# Patient Record
Sex: Male | Born: 1955
Health system: Southern US, Community
[De-identification: ages and names within clinical notes are randomized; demographics above are authoritative.]

## PROBLEM LIST (undated history)

## (undated) HISTORY — PX: FINGER SURGERY: SHX640

## (undated) HISTORY — PX: TONSILLECTOMY: SUR1361

## (undated) HISTORY — PX: ANKLE SURGERY: SHX546

---

## 2014-05-10 DIAGNOSIS — N138 Other obstructive and reflux uropathy: Secondary | ICD-10-CM | POA: Insufficient documentation

## 2014-05-10 DIAGNOSIS — N401 Enlarged prostate with lower urinary tract symptoms: Secondary | ICD-10-CM

## 2014-09-14 ENCOUNTER — Ambulatory Visit (INDEPENDENT_AMBULATORY_CARE_PROVIDER_SITE_OTHER): Payer: BLUE CROSS/BLUE SHIELD | Admitting: Family Medicine

## 2014-09-14 ENCOUNTER — Encounter: Payer: Self-pay | Admitting: Family Medicine

## 2014-09-14 VITALS — BP 126/78 | HR 69 | Ht 69.0 in | Wt 187.0 lb

## 2014-09-14 DIAGNOSIS — M722 Plantar fascial fibromatosis: Secondary | ICD-10-CM | POA: Diagnosis not present

## 2014-09-14 NOTE — Patient Instructions (Signed)
You have plantar fasciitis Take tylenol or aleve as needed for pain  Plantar fascia stretch for 20-30 seconds (do 3 of these) in morning Lowering/raise on a step exercises 3 x 10 once or twice a day - this is very important for long term recovery. Can add heel walks, toe walks forward and backward as well Ice heel for 15 minutes as needed. Avoid flat shoes/barefoot walking as much as possible. Arch straps have been shown to help with pain. Orthotics with heel lift may be helpful (try something like dr. Jari Sportsmanscholls active series). Steroid injection is a consideration for short term pain relief if you are struggling. Physical therapy is also an option. Follow up with me in 6 weeks.

## 2014-09-16 ENCOUNTER — Encounter: Payer: Self-pay | Admitting: Family Medicine

## 2014-09-16 DIAGNOSIS — M722 Plantar fascial fibromatosis: Secondary | ICD-10-CM | POA: Insufficient documentation

## 2014-09-16 NOTE — Assessment & Plan Note (Signed)
reviewed home exercises to do daily.  Icing, nsaids/tylenol as needed.  Arch binders, otc orthotics.  Consider physical therapy, injection if not improving.  F/u in 6 weeks.  Activities as tolerated.

## 2014-09-16 NOTE — Progress Notes (Signed)
PCP: No primary care provider on file.  Subjective:   HPI: Patient is a 59 y.o. male here for right heel pain.  Patient reports right heel pain started on 6/22 when he changed running route that included more hills than usual. No acute injury. Painful when driving down the road. No swelilng or bruising. Not running now. Using a night sock to keep in dorsiflexion. Has been taping. Worse when walking at the beach this weekend.  No past medical history on file.  No current outpatient prescriptions on file prior to visit.   No current facility-administered medications on file prior to visit.    Past Surgical History  Procedure Laterality Date  . Ankle surgery      No Known Allergies  History   Social History  . Marital Status: Married    Spouse Name: N/A  . Number of Children: N/A  . Years of Education: N/A   Occupational History  . Not on file.   Social History Main Topics  . Smoking status: Never Smoker   . Smokeless tobacco: Not on file  . Alcohol Use: Not on file  . Drug Use: Not on file  . Sexual Activity: Not on file   Other Topics Concern  . Not on file   Social History Narrative    No family history on file.  BP 126/78 mmHg  Pulse 69  Ht  (1.753 m)  Wt 187 lb (84.823 kg)  BMI 27.60 kg/m2  Review of Systems: See HPI above.    Objective:  Physical Exam:  Gen: NAD  Right foot/ankle: No gross deformity, swelling, ecchymoses FROM TTP anterior plantar calcaneus at plantar fascia insertion. Negative ant drawer and talar tilt.   Negative syndesmotic compression. Thompsons test negative. NV intact distally.    Assessment & Plan:  1. Right plantar fasciitis - reviewed home exercises to do daily.  Icing, nsaids/tylenol as needed.  Arch binders, otc orthotics.  Consider physical therapy, injection if not improving.  F/u in 6 weeks.  Activities as tolerated.

## 2014-10-26 ENCOUNTER — Encounter (INDEPENDENT_AMBULATORY_CARE_PROVIDER_SITE_OTHER): Payer: Self-pay

## 2014-10-26 ENCOUNTER — Encounter: Payer: Self-pay | Admitting: Family Medicine

## 2014-10-26 ENCOUNTER — Ambulatory Visit: Payer: BLUE CROSS/BLUE SHIELD | Admitting: Family Medicine

## 2014-10-26 ENCOUNTER — Ambulatory Visit (INDEPENDENT_AMBULATORY_CARE_PROVIDER_SITE_OTHER): Payer: BLUE CROSS/BLUE SHIELD | Admitting: Family Medicine

## 2014-10-26 VITALS — BP 143/84 | HR 66 | Ht 69.0 in | Wt 187.0 lb

## 2014-10-26 DIAGNOSIS — M722 Plantar fascial fibromatosis: Secondary | ICD-10-CM

## 2014-10-26 NOTE — Patient Instructions (Signed)
Continue with home exercises, inserts as you have been. Do these for at least another 6 weeks. Call me if you want to try physical therapy, active release, orthotics, injection (I don't think you'll need these). Follow up with me in 6 weeks or as needed otherwise.

## 2014-10-27 NOTE — Assessment & Plan Note (Signed)
Improving.  Patient would like to continue with home exercises, arch binders, otc orthotics.  He will consider physical therapy, injection if not improving.  Follow up with me in 6 weeks or as needed.

## 2014-10-27 NOTE — Progress Notes (Signed)
PCP: No primary care provider on file.  Subjective:   HPI: Patient is a 59 y.o. male here for right heel pain.  7/19: Patient reports right heel pain started on 6/22 when he changed running route that included more hills than usual. No acute injury. Painful when driving down the road. No swelilng or bruising. Not running now. Using a night sock to keep in dorsiflexion. Has been taping. Worse when walking at the beach this weekend.  8/30: Patient reports he has improved especially in past week. Doing home exercises, using arch binders. Has inserts as well in his shoes. Has not tried running yet. Pain level 2/10. Noticed pain worsened when he wore old tennis shoes without inserts when working in the yard.  No past medical history on file.  No current outpatient prescriptions on file prior to visit.   No current facility-administered medications on file prior to visit.    Past Surgical History  Procedure Laterality Date  . Ankle surgery      No Known Allergies  Social History   Social History  . Marital Status: Married    Spouse Name: N/A  . Number of Children: N/A  . Years of Education: N/A   Occupational History  . Not on file.   Social History Main Topics  . Smoking status: Never Smoker   . Smokeless tobacco: Not on file  . Alcohol Use: Not on file  . Drug Use: Not on file  . Sexual Activity: Not on file   Other Topics Concern  . Not on file   Social History Narrative    No family history on file.  BP 143/84 mmHg  Pulse 66  Ht  (1.753 m)  Wt 187 lb (84.823 kg)  BMI 27.60 kg/m2  Review of Systems: See HPI above.    Objective:  Physical Exam:  Gen: NAD  Right foot/ankle: No gross deformity, swelling, ecchymoses FROM Mild TTP anterior plantar calcaneus at plantar fascia insertion. Negative ant drawer and talar tilt.   Negative syndesmotic compression. Thompsons test negative. NV intact distally.    Assessment & Plan:  1. Right  plantar fasciitis - Improving.  Patient would like to continue with home exercises, arch binders, otc orthotics.  He will consider physical therapy, injection if not improving.  Follow up with me in 6 weeks or as needed.

## 2017-06-28 ENCOUNTER — Other Ambulatory Visit: Payer: Self-pay

## 2017-06-28 ENCOUNTER — Encounter (HOSPITAL_BASED_OUTPATIENT_CLINIC_OR_DEPARTMENT_OTHER): Payer: Self-pay | Admitting: Emergency Medicine

## 2017-06-28 ENCOUNTER — Emergency Department (HOSPITAL_BASED_OUTPATIENT_CLINIC_OR_DEPARTMENT_OTHER): Payer: BLUE CROSS/BLUE SHIELD

## 2017-06-28 ENCOUNTER — Emergency Department (HOSPITAL_BASED_OUTPATIENT_CLINIC_OR_DEPARTMENT_OTHER)
Admission: EM | Admit: 2017-06-28 | Discharge: 2017-06-28 | Disposition: A | Discharge status: Home / Self Care | Payer: BLUE CROSS/BLUE SHIELD | Attending: Emergency Medicine | Admitting: Emergency Medicine

## 2017-06-28 DIAGNOSIS — M10072 Idiopathic gout, left ankle and foot: Secondary | ICD-10-CM | POA: Insufficient documentation

## 2017-06-28 DIAGNOSIS — R52 Pain, unspecified: Secondary | ICD-10-CM

## 2017-06-28 DIAGNOSIS — M109 Gout, unspecified: Secondary | ICD-10-CM

## 2017-06-28 DIAGNOSIS — M79675 Pain in left toe(s): Secondary | ICD-10-CM | POA: Diagnosis present

## 2017-06-28 MED ORDER — PREDNISONE 20 MG PO TABS: 20.0000 mg | ORAL_TABLET | Freq: Every day | ORAL | 0 refills | Status: AC

## 2017-06-28 MED ORDER — COLCHICINE 0.6 MG PO TABS: 0.6000 mg | ORAL_TABLET | Freq: Every day | ORAL | 0 refills | Status: DC

## 2017-06-28 MED ORDER — TRAMADOL HCL 50 MG PO TABS: 50.0000 mg | ORAL_TABLET | Freq: Four times a day (QID) | ORAL | 0 refills | Status: DC | PRN

## 2017-06-28 MED FILL — predniSONE 20 MG TABS: 20 | 5 days supply | Qty: 5 | Fill #0

## 2017-06-28 MED FILL — traMADol HCL 50 MG TABS: 50 | 2 days supply | Qty: 10 | Fill #0

## 2017-06-28 MED FILL — COLCHICINE 0.6 MG TABS: 0.6 | 7 days supply | Qty: 7 | Fill #0

## 2017-06-28 NOTE — ED Provider Notes (Signed)
Emergency Department Provider Note   I have reviewed the triage vital signs and the nursing notes.   HISTORY  Chief Complaint Foot Pain   HPI Jeremy Juarez is a 62 y.o. male with PMH of BPH presents to the emergency department for evaluation of pain of the left great toe.  Symptoms have been worsening over the past 2 days.  The patient did some hiking recently which is not out of the ordinary for him.  He denies any pain during the hike or injury.  He noticed pain in the left foot/toe with developing redness yesterday.  No fevers or chills.  He states he has severe pain that is worse with movement or even light touching of the area.  Last night he had difficulty sleeping and could not even allow the sheets to rest on his foot.  No pain in the ankle, knee, hip.  No redness tracking up the foot or leg. No history of gout.    History reviewed. No pertinent past medical history.  Patient Active Problem List   Diagnosis Date Noted  . Plantar fasciitis, right 09/16/2014  . Benign prostatic hyperplasia with urinary obstruction 05/10/2014    Past Surgical History:  Procedure Laterality Date  . ANKLE SURGERY    . FINGER SURGERY    . TONSILLECTOMY        Allergies Patient has no known allergies.  No family history on file.  Social History Social History   Tobacco Use  . Smoking status: Never Smoker  . Smokeless tobacco: Never Used  Substance Use Topics  . Alcohol use: Never    Alcohol/week: 0.0 oz    Frequency: Never  . Drug use: Never    Review of Systems  Constitutional: No fever/chills Eyes: No visual changes. ENT: No sore throat. Cardiovascular: Denies chest pain. Respiratory: Denies shortness of breath. Gastrointestinal: No abdominal pain.  No nausea, no vomiting.  No diarrhea.  No constipation. Genitourinary: Negative for dysuria. Musculoskeletal: Negative for back pain. Left great toe pain/redness.  Skin: Negative for rash. Neurological: Negative for  headaches, focal weakness or numbness.  10-point ROS otherwise negative.  ____________________________________________   PHYSICAL EXAM:  VITAL SIGNS: ED Triage Vitals  Enc Vitals Group     BP 06/28/17 0856 (!) 156/97     Pulse Rate 06/28/17 0856 74     Resp 06/28/17 0856 16     Temp 06/28/17 0856 98.5 F (36.9 C)     Temp Source 06/28/17 0856 Oral     SpO2 06/28/17 0856 95 %     Weight 06/28/17 0857 192 lb (87.1 kg)     Height 06/28/17 0857  (1.753 m)     Pain Score 06/28/17 0857 7   Constitutional: Alert and oriented. Well appearing and in no acute distress. Eyes: Conjunctivae are normal. Head: Atraumatic. Nose: No congestion/rhinnorhea. Mouth/Throat: Mucous membranes are moist.  Neck: No stridor.  Respiratory: Normal respiratory effort.  Gastrointestinal:  No distention.  Musculoskeletal: No lower extremity edema. Erythema and pain over the left 1 MTP. No erythema overlying the toe or dorsum of the foot.  Neurologic:  Normal speech and language. No gross focal neurologic deficits are appreciated.  Skin:  Skin is warm, dry and intact. Erythema over the left MTP as described above.   ____________________________________________  RADIOLOGY  Dg Foot Complete Left  Result Date: 06/28/2017 CLINICAL DATA:  Left great toe and foot pain EXAM: LEFT FOOT - COMPLETE 3+ VIEW COMPARISON:  None. FINDINGS: There is no  evidence of fracture or dislocation. There is no evidence of arthropathy or other focal bone abnormality. Soft tissues are unremarkable. IMPRESSION: Negative. Electronically Signed   By: Charlett Nose M.D.   On: 06/28/2017 09:39    ____________________________________________   PROCEDURES  Procedure(s) performed:   Procedures  None ____________________________________________   INITIAL IMPRESSION / ASSESSMENT AND PLAN / ED COURSE  Pertinent labs & imaging results that were available during my care of the patient were reviewed by me and considered in my  medical decision making (see chart for details).  Patient presents to the emergency department with left toe pain and redness.  This was in the setting of a recent hike.  Doubt hairline fracture but will obtain plain film of the left foot given no prior gout history.  The patient's presentation is most consistent with gout arthritis.  Discussed dietary modifications to prevent future flares.  Patient with no evidence of septic joint.  Plain film of the foot is negative for fracture. Plan for gout treatment and close PCP follow up.   At this time, I do not feel there is any life-threatening condition present. I have reviewed and discussed all results (EKG, imaging, lab, urine as appropriate), exam findings with patient. I have reviewed nursing notes and appropriate previous records.  I feel the patient is safe to be discharged home without further emergent workup. Discussed usual and customary return precautions. Patient and family (if present) verbalize understanding and are comfortable with this plan.  Patient will follow-up with their primary care provider. If they do not have a primary care provider, information for follow-up has been provided to them. All questions have been answered.  ____________________________________________  FINAL CLINICAL IMPRESSION(S) / ED DIAGNOSES  Final diagnoses:  Acute gout involving toe of left foot, unspecified cause    NEW OUTPATIENT MEDICATIONS STARTED DURING THIS VISIT:  Discharge Medication List as of 06/28/2017  9:53 AM    START taking these medications   Details  colchicine 0.6 MG tablet Take 1 tablet (0.6 mg total) by mouth daily for 7 days., Starting Fri 06/28/2017, Until Fri 07/05/2017, Print    predniSONE (DELTASONE) 20 MG tablet Take 1 tablet (20 mg total) by mouth daily for 5 days., Starting Fri 06/28/2017, Until Wed 07/03/2017, Print    traMADol (ULTRAM) 50 MG tablet Take 1 tablet (50 mg total) by mouth every 6 (six) hours as needed., Starting Fri  06/28/2017, Print        Note:  This document was prepared using Dragon voice recognition software and may include unintentional dictation errors.  Alona Bene, MD Emergency Medicine    Ashonti Leandro, Arlyss Repress, MD 06/28/17 [phone]

## 2017-06-28 NOTE — ED Notes (Signed)
Patient transported to X-ray 

## 2017-06-28 NOTE — ED Notes (Signed)
ED Provider at bedside. 

## 2017-06-28 NOTE — Discharge Instructions (Signed)
You were seen in the ED today with toe pain that is caused by gout. I have attached some information about this diagnosis. We are starting several medications to help your symptoms. Call your PCP to schedule a follow up appointment in the next 1-2 weeks. Return to the ED immediately with any fever, chills, or worsening redness in the foot/leg.

## 2017-06-28 NOTE — ED Triage Notes (Signed)
Pain and swelling to left great toe and just proximally x2 days with no known injury.

## 2017-10-23 ENCOUNTER — Encounter: Payer: Self-pay | Admitting: Rehabilitative and Restorative Service Providers"

## 2017-10-23 ENCOUNTER — Ambulatory Visit
Payer: BLUE CROSS/BLUE SHIELD | Attending: Orthopedic Surgery | Admitting: Rehabilitative and Restorative Service Providers"

## 2017-10-23 DIAGNOSIS — R29898 Other symptoms and signs involving the musculoskeletal system: Secondary | ICD-10-CM | POA: Diagnosis present

## 2017-10-23 DIAGNOSIS — M6281 Muscle weakness (generalized): Secondary | ICD-10-CM | POA: Diagnosis present

## 2017-10-23 DIAGNOSIS — M25572 Pain in left ankle and joints of left foot: Secondary | ICD-10-CM | POA: Diagnosis present

## 2017-10-23 NOTE — Therapy (Signed)
[facility] [place] [address]  [address] [place], [place], [address] Phone: [phone]   Fax:  [phone]  Physical Therapy Evaluation  Patient Details  Name: Jeremy GottronRobert Juarez MRN: [id number] Date of Birth: 12/05/1955 Referring Provider: Dr Toni Arthursjohn Hewitt    Encounter Date: 10/23/2017  PT End of Session - 10/23/17 1054    Visit Number  1    Number of Visits  12    Date for PT Re-Evaluation  12/04/17    PT Start Time  1054    PT Stop Time  1145    PT Time Calculation (min)  51 min    Activity Tolerance  Patient tolerated treatment well       History reviewed. No pertinent past medical history.  Past Surgical History:  Procedure Laterality Date  . ANKLE SURGERY    . FINGER SURGERY    . TONSILLECTOMY      There were no vitals filed for this visit.   Subjective Assessment - 10/23/17 1057    Subjective  Patient reports that he began to experience Rt foot pain 6/18 after running. He was treated with rest with no impovement. He was seen by second ortho last week with diagnosis of Posterior tibila tendon dysfunction. he was placed in a ankle foot orthosis with some impovement.     Pertinent History  foot injury stepping on metal rod 1986 with surgery following - he has had no trouble until 6/18; HOH     Diagnostic tests  MRI    Patient Stated Goals  avoid surgery; return to running     Currently in Pain?  Yes    Pain Score  1     Pain Location  Ankle    Pain Orientation  Right    Pain Descriptors / Indicators  Aching;Dull    Pain Type  Chronic pain    Pain Onset  More than a month ago    Pain Frequency  Intermittent    Aggravating Factors   direct pressure; running; stretching; jumping; standing on one leg    Pain Relieving Factors  brace; ice temporary improvement          [facility] PT Assessment - 10/23/17 0001      Assessment   Medical Diagnosis  Rt ankle/foot pain     Referring Provider  Dr Toni Arthursjohn Hewitt     Onset  Date/Surgical Date  07/27/16    Hand Dominance  Right    Next MD Visit  12/20/17    Prior Therapy  none      Precautions   Precautions  None      Balance Screen   Has the patient fallen in the past 6 months  No    Has the patient had a decrease in activity level because of a fear of falling?   No    Is the patient reluctant to leave their home because of a fear of falling?   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   retired Hotel managermilitary; Emergency planning/management officerpolice officer x 16 yrs    Leisure  running; hiking; yard work; chores around the house; wt lifting  free weights ~ 4o min/day 5 days/wk       Observation/Other Assessments   Focus on Therapeutic Outcomes (FOTO)   40% limitation       Sensation   Additional Comments  WNL's per pt report       Posture/Postural Control   Posture Comments  WNL's       AROM   Overall AROM Comments  WNL's bilat hips/knees    Right Ankle Dorsiflexion  3    Right Ankle Plantar Flexion  48    Right Ankle Inversion  24    Right Ankle Eversion  10    Left Ankle Dorsiflexion  6    Left Ankle Plantar Flexion  51    Left Ankle Inversion  22    Left Ankle Eversion  14      Strength   Overall Strength Comments  5/5 bilat hips/knees     Right Ankle Dorsiflexion  5/5    Right Ankle Plantar Flexion  4+/5   pain with slight decreased balance req touching for balance    Right Ankle Inversion  5/5   discomfort    Right Ankle Eversion  5/5    Left Ankle Dorsiflexion  5/5    Left Ankle Plantar Flexion  5/5    Left Ankle Inversion  5/5    Left Ankle Eversion  5/5      Palpation   Palpation comment  point tender medial ankle area                 Objective measurements completed on  examination: See above findings.      [facility] Adult PT Treatment/Exercise - 10/23/17 0001      Ankle Exercises: Stretches   Soleus Stretch  3 reps;30 seconds    Gastroc Stretch  3 reps;30 seconds      Ankle Exercises: Standing   SLS  30 sec x 3 each LE minimal UE support              PT Education - 10/23/17 1136    Education Details  HEP     Person(s) Educated  Patient    Methods  Explanation;Demonstration;Tactile cues;Verbal cues;Handout    Comprehension  Verbalized understanding;Returned demonstration;Verbal cues required;Tactile cues required          PT Long Term Goals - 10/23/17 1202      PT LONG TERM GOAL #1   Title  Decrease pain Rt ankle/foot by 50-75% allowing patient to ambulate > 1 mile without pain 12/04/17    Time  6    Period  Weeks    Status  New      PT LONG TERM GOAL #2   Title  Increase Rt ankle DF to 8 to 10 deg 12/04/17    Time  6    Period  Weeks    Status  New      PT LONG TERM GOAL #3   Title  Increase strength Rt ankle to 5/5 without pain 12/04/17    Time  6    Period  Weeks    Status  New      PT LONG TERM GOAL #4   Title  Independent HEP 12/04/17    Time  6    Period  Weeks    Status  New      PT LONG TERM GOAL #5   Title  Improve FOTO to </= 32% limitation 12/04/17    Time  6    Period  Weeks    Status  New             Plan - 10/23/17 1157    Clinical Impression Statement  Jeremy Juarez presents with 14 month history of Rt medial foot/ank;e/arch pain. He was disgnosed with posterir tibial tendon dysfunction and placed in an ankle brace last week. Patient reports that the brace has helped his pain significantly. He presents with persistent intermittent aching and discomfort in the medial Lt ankle/foot area; tenderness to palpation; decreased ROM and strength Rt ankle; decreased exercise/running ability; pain on a daily basis. Patient will benefit from PT to address problems identified.     Clinical Presentation  Stable    Clinical  Decision Making  Low    Rehab Potential  Good    PT Frequency  2x / week    PT Duration  6 weeks    PT Treatment/Interventions  Patient/family education;ADLs/Self Care Home Management;Cryotherapy;Electrical Stimulation;Iontophoresis 4mg /ml Dexamethasone;Moist Heat;Ultrasound;Dry needling;Manual techniques;Neuromuscular re-education;Gait training;Therapeutic activities;Therapeutic exercise    PT Next Visit Plan  review HEP; assess response to ionto; progress with stretching and stregthening Rt ankle/foot; modalities as indicated     Consulted and Agree with Plan of Care  Patient       Patient will benefit from skilled therapeutic intervention in order to improve the following deficits and impairments:  Pain, Decreased mobility, Decreased range of motion, Decreased strength, Decreased activity tolerance  Visit Diagnosis: Pain in left ankle and joints of left foot - Plan: PT plan of care cert/re-cert  Other symptoms and signs involving the musculoskeletal system - Plan: PT plan of care cert/re-cert  Muscle weakness (generalized) - Plan: PT plan of care cert/re-cert     Problem List Patient Active Problem List   Diagnosis Date Noted  . Plantar fasciitis, right 09/16/2014  . Benign prostatic hyperplasia with urinary obstruction 05/10/2014    Terriah Reggio Rober Minion PT, MPH 10/23/2017, 12:08 PM  [facility] [address]  [address] [place], [place], [address] Phone: [phone]   Fax:  [phone]  Name: Jeremy Juarez MRN: [id number] Date of Birth: [birth year]

## 2017-10-23 NOTE — Patient Instructions (Signed)

## 2017-10-30 ENCOUNTER — Encounter: Payer: Self-pay | Admitting: Physical Therapy

## 2017-10-30 ENCOUNTER — Ambulatory Visit: Payer: BLUE CROSS/BLUE SHIELD | Attending: Orthopedic Surgery | Admitting: Physical Therapy

## 2017-10-30 DIAGNOSIS — M25572 Pain in left ankle and joints of left foot: Secondary | ICD-10-CM | POA: Insufficient documentation

## 2017-10-30 DIAGNOSIS — M6281 Muscle weakness (generalized): Secondary | ICD-10-CM | POA: Diagnosis present

## 2017-10-30 DIAGNOSIS — R29898 Other symptoms and signs involving the musculoskeletal system: Secondary | ICD-10-CM

## 2017-10-30 DIAGNOSIS — M25571 Pain in right ankle and joints of right foot: Secondary | ICD-10-CM | POA: Insufficient documentation

## 2017-10-30 NOTE — Therapy (Signed)
[facility] [address]  [address] [place], [place], [address] Phone: [phone]   Fax:  [phone]  Physical Therapy Treatment  Patient Details  Name: Jeremy Juarez MRN: [id number] Date of Birth: [birth year] Referring Provider: Dr Toni Arthurs    Encounter Date: 10/30/2017  PT End of Session - 10/30/17 0802    Visit Number  2    Number of Visits  12    Date for PT Re-Evaluation  12/04/17    Authorization Type  [employer] - PT only    PT Start Time  0802    PT Stop Time  0845    PT Time Calculation (min)  43 min    Activity Tolerance  Patient tolerated treatment well    Behavior During Therapy  [facility] for tasks assessed/performed       History reviewed. No pertinent past medical history.  Past Surgical History:  Procedure Laterality Date  . ANKLE SURGERY    . FINGER SURGERY    . TONSILLECTOMY      There were no vitals filed for this visit.  Subjective Assessment - 10/30/17 0805    Subjective  Pt noting good relief from HEP stretches, waking up with less pain. Not sure of benefit from ionto patch.    Pertinent History  foot injury stepping on metal rod 1986 with surgery following - he has had no trouble until 6/18; HOH     Diagnostic tests  MRI    Patient Stated Goals  avoid surgery; return to running     Currently in Pain?  Yes    Pain Score  1    <1/10   Pain Location  Foot   arch   Pain Orientation  Right    Pain Descriptors / Indicators  Discomfort;Aching    Pain Type  Chronic pain                       [facility] Adult PT Treatment/Exercise - 10/30/17 0802      Self-Care   Self-Care  Heat/Ice Application    Heat/Ice Application  Provided verbal instruction in ice application inlcuding ice massage in the event of increased pain or irritation. Pt denying need for cryotherapy today.      Exercises   Exercises  Ankle      Ankle Exercises: Stretches   Soleus Stretch  2 reps;30 seconds    Gastroc Stretch  2 reps;30 seconds    Other Stretch  B posterior tibialis stretch with towel under lateral edge of foot 2 x 30 sec      Ankle Exercises: Aerobic   Recumbent Bike  L2 x 6 min      Ankle Exercises: Standing   SLS  Blue foam oval - 30 sec x 3 each LE minimal UE support (1 pole A)    Heel Raises  Both;10 reps;3 seconds;Right   R only eccentric lowering   Heel Raises Limitations  ~2" negative heel on back of UBE platform      Ankle Exercises: Seated   Other Seated Ankle Exercises  R 4 way ankle with red TB x10    Other Seated Ankle Exercises  Long sitting toe curl with red TB x10             PT Education - 10/30/17 0844    Education Details  HEP addtion - initial strengthening exercises with red TB; Instruction in use of cryotherapy for pain/inflammation management  Person(s) Educated  Patient    Methods  Explanation;Demonstration;Handout    Comprehension  Verbalized understanding;Returned demonstration          PT Long Term Goals - 10/23/17 1202      PT LONG TERM GOAL #1   Title  Decrease pain Rt ankle/foot by 50-75% allowing patient to ambulate > 1 mile without pain 12/04/17    Time  6    Period  Weeks    Status  New      PT LONG TERM GOAL #2   Title  Increase Rt ankle DF to 8 to 10 deg 12/04/17    Time  6    Period  Weeks    Status  New      PT LONG TERM GOAL #3   Title  Increase strength Rt ankle to 5/5 without pain 12/04/17    Time  6    Period  Weeks    Status  New      PT LONG TERM GOAL #4   Title  Independent HEP 12/04/17    Time  6    Period  Weeks    Status  New      PT LONG TERM GOAL #5   Title  Improve FOTO to </= 32% limitation 12/04/17    Time  6    Period  Weeks    Status  New            Plan - 10/30/17 0808    Clinical Impression Statement  Jeremy Juarez reporting good initial releif from HEP stretches and reports he has been able to increase SLS time to ~60 sec with HEP. Initated basic ankle strengthening today with good  tolerance and updated HEP accordingly. Pt noitng some fatigue but denies increased pain following exercises today - provided instruction in use of cryotherapy as needed for pain or increased irritation following exercises or general activity. with pt verbalizing understanding.    Rehab Potential  Good    PT Frequency  2x / week    PT Duration  6 weeks    PT Treatment/Interventions  Patient/family education;ADLs/Self Care Home Management;Cryotherapy;Electrical Stimulation;Iontophoresis 4mg /ml Dexamethasone;Moist Heat;Ultrasound;Dry needling;Manual techniques;Neuromuscular re-education;Gait training;Therapeutic activities;Therapeutic exercise    PT Next Visit Plan  --    Consulted and Agree with Plan of Care  Patient       Patient will benefit from skilled therapeutic intervention in order to improve the following deficits and impairments:  Pain, Decreased mobility, Decreased range of motion, Decreased strength, Decreased activity tolerance  Visit Diagnosis: Pain in left ankle and joints of left foot  Other symptoms and signs involving the musculoskeletal system  Muscle weakness (generalized)     Problem List Patient Active Problem List   Diagnosis Date Noted  . Plantar fasciitis, right 09/16/2014  . Benign prostatic hyperplasia with urinary obstruction 05/10/2014    [name], PT, MPT 10/30/2017, 9:01 AM  [facility] [address]  [address] [place], [place], [address] Phone: [phone]   Fax:  [phone]  Name: Jeremy Juarez MRN: [id number] Date of Birth: 19-Feb-1956

## 2017-11-04 ENCOUNTER — Ambulatory Visit: Payer: BLUE CROSS/BLUE SHIELD | Admitting: Physical Therapy

## 2017-11-04 DIAGNOSIS — M25572 Pain in left ankle and joints of left foot: Secondary | ICD-10-CM | POA: Diagnosis not present

## 2017-11-04 DIAGNOSIS — M6281 Muscle weakness (generalized): Secondary | ICD-10-CM

## 2017-11-04 DIAGNOSIS — M25571 Pain in right ankle and joints of right foot: Secondary | ICD-10-CM

## 2017-11-04 NOTE — Therapy (Signed)
[facility] Outpatient Rehabilitation [facility] [address]  [address] [place], [place], [address] Phone: [phone]   Fax:  [phone]  Physical Therapy Treatment  Patient Details  Name: Jeremy Juarez MRN: [id number] Date of Birth: 07-03-1955 Referring Provider: Dr Toni Arthurs    Encounter Date: 11/04/2017  PT End of Session - 11/04/17 0801    Visit Number  3    Number of Visits  12    Date for PT Re-Evaluation  12/04/17    Authorization Type  [employer] - PT only    PT Start Time  0801    PT Stop Time  0844    PT Time Calculation (min)  43 min    Activity Tolerance  Patient tolerated treatment well    Behavior During Therapy  [facility] for tasks assessed/performed       No past medical history on file.  Past Surgical History:  Procedure Laterality Date  . ANKLE SURGERY    . FINGER SURGERY    . TONSILLECTOMY      There were no vitals filed for this visit.  Subjective Assessment - 11/04/17 0842    Subjective  Patient continues to report improvement.    Pertinent History  foot injury stepping on metal rod 1986 with surgery following - he has had no trouble until 6/18; HOH     Diagnostic tests  MRI    Patient Stated Goals  avoid surgery; return to running     Currently in Pain?  Yes    Pain Score  1     Pain Location  Foot    Pain Orientation  Right    Pain Descriptors / Indicators  Aching;Discomfort    Pain Type  Chronic pain                       [facility] Adult PT Treatment/Exercise - 11/04/17 0001      Ankle Exercises: Standing   SLS  Blue foam - 30 sec x 3 RLE minimal UE support     Balance Master: Limits for Stability  step ups onto sitfit R 2x10    Balance Master: Static  isometric arch contraction through 1st metatarsal and heel 5 x 10 sec    Heel Raises  Right;5 reps;2 seconds   3 sets of 5    Heel Raises Limitations  off step    Toe Walk (Round Trip)  x 2      Ankle Exercises: Seated   Other Seated Ankle Exercises   R 4 way ankle with red TB 2x10    Other Seated Ankle Exercises  Long sitting toe curl with red TB x10                  PT Long Term Goals - 11/04/17 0841      PT LONG TERM GOAL #1   Title  Decrease pain Rt ankle/foot by 50-75% allowing patient to ambulate > 1 mile without pain 12/04/17    Baseline  still feeling with walking 1 mile 1/10    Time  6    Period  Weeks    Status  On-going            Plan - 11/04/17 9417    Clinical Impression Statement  Patient did well with TE today. He continues to have fatique with eccentric lowering with heel raises and with resisted inversion. Able to toe walk without difficulty.     Rehab  Potential  Good    PT Frequency  2x / week    PT Duration  6 weeks    PT Treatment/Interventions  Patient/family education;ADLs/Self Care Home Management;Cryotherapy;Electrical Stimulation;Iontophoresis 4mg /ml Dexamethasone;Moist Heat;Ultrasound;Dry needling;Manual techniques;Neuromuscular re-education;Gait training;Therapeutic activities;Therapeutic exercise    PT Next Visit Plan  continue with strengthening    Consulted and Agree with Plan of Care  Patient       Patient will benefit from skilled therapeutic intervention in order to improve the following deficits and impairments:  Pain, Decreased mobility, Decreased range of motion, Decreased strength, Decreased activity tolerance  Visit Diagnosis: Pain in right ankle and joints of right foot  Muscle weakness (generalized)     Problem List Patient Active Problem List   Diagnosis Date Noted  . Plantar fasciitis, right 09/16/2014  . Benign prostatic hyperplasia with urinary obstruction 05/10/2014    Jayesh Marbach PT 11/04/2017, 9:03 AM  [facility] [address]  [address] [place], [place], [address] Phone: [phone]   Fax:  [phone]  Name: Jeremy Juarez MRN: [id number] Date of Birth: 08-05-1955

## 2017-11-06 ENCOUNTER — Ambulatory Visit: Payer: BLUE CROSS/BLUE SHIELD | Admitting: Physical Therapy

## 2017-11-11 ENCOUNTER — Encounter: Payer: Self-pay | Admitting: Physical Therapy

## 2017-11-11 ENCOUNTER — Ambulatory Visit: Payer: BLUE CROSS/BLUE SHIELD | Admitting: Physical Therapy

## 2017-11-11 DIAGNOSIS — M25571 Pain in right ankle and joints of right foot: Secondary | ICD-10-CM

## 2017-11-11 DIAGNOSIS — M25572 Pain in left ankle and joints of left foot: Secondary | ICD-10-CM

## 2017-11-11 DIAGNOSIS — M6281 Muscle weakness (generalized): Secondary | ICD-10-CM

## 2017-11-11 DIAGNOSIS — R29898 Other symptoms and signs involving the musculoskeletal system: Secondary | ICD-10-CM

## 2017-11-11 NOTE — Therapy (Signed)
[facility] [address]  [address] [place], [place], [address] Phone: [phone]   Fax:  [phone]  Physical Therapy Treatment  Patient Details  Name: Jeremy Juarez MRN: [id number] Date of Birth: 12/24/1955 Referring Provider: Dr Toni Arthursjohn Hewitt    Encounter Date: 11/11/2017  PT End of Session - 11/11/17 0800    Visit Number  4    Number of Visits  12    Date for PT Re-Evaluation  12/04/17    Authorization Type  [employer] - PT only    PT Start Time  0800    PT Stop Time  0840    PT Time Calculation (min)  40 min    Activity Tolerance  Patient tolerated treatment well    Behavior During Therapy  [facility] for tasks assessed/performed       History reviewed. No pertinent past medical history.  Past Surgical History:  Procedure Laterality Date  . ANKLE SURGERY    . FINGER SURGERY    . TONSILLECTOMY      There were no vitals filed for this visit.  Subjective Assessment - 11/11/17 0802    Subjective  Pt denies pain today but still notes an awareness of a tight feeling in the arch of his foot.    Pertinent History  foot injury stepping on metal rod 1986 with surgery following - he has had no trouble until 6/18; HOH     Diagnostic tests  MRI    Patient Stated Goals  avoid surgery; return to running     Currently in Pain?  No/denies                       [facility] Treatment/Exercise - 11/11/17 0800      Exercises   Exercises  Ankle      Ankle Exercises: Aerobic   Recumbent Bike  L2 x 6 min      Ankle Exercises: Standing   Vector Stance  Right   10 reps   Vector Stance Limitations  4 way clocks on blue foam oval    SLS  R SLS on blue foam oval + B pallof press with red TB x10 each    Toe Walk (Round Trip)  90 ft x 2 - 2nd lap vaulting on toes    Balance Beam  Tandem gait fwd/back x 2; B Sidestepping x 2; B Sidestepping with heels/toes on beam x 2 each    Side Shuffle (Round Trip)  B  sidestepping with red TB at forefeet 2 x30 ft    Other Standing Ankle Exercises  R fwd step-ups to 8" step + blue foam oval x10    Other Standing Ankle Exercises  R lateral eccentric step-down with heel touch from blue foam oval on 6" step x10; 1 pole A      Ankle Exercises: Seated   Other Seated Ankle Exercises  R 4 way ankle with green TB x10                  PT Long Term Goals - 11/11/17 0805      PT LONG TERM GOAL #1   Title  Decrease pain Rt ankle/foot by 50-75% allowing patient to ambulate > 1 mile without pain 12/04/17    Status  On-going    Target Date  12/04/17      PT LONG TERM GOAL #2   Title  Increase Rt ankle DF to  8 to 10 deg 12/04/17    Status  On-going    Target Date  12/04/17      PT LONG TERM GOAL #3   Title  Increase strength Rt ankle to 5/5 without pain 12/04/17    Status  On-going    Target Date  12/04/17      PT LONG TERM GOAL #4   Title  Independent HEP 12/04/17    Status  On-going    Target Date  12/04/17      PT LONG TERM GOAL #5   Title  Improve FOTO to </= 32% limitation 12/04/17    Status  On-going    Target Date  12/04/17            Plan - 11/11/17 0804    Clinical Impression Statement  Robbie reporting continuing improvement in pain, now only noting sensation of tighness. Good tolerance for HEP strengthening exercises and able to progress theraband to green TB. Progressed therapeutic exercises & activities to include more unstable surfaces for proprioceptive training - good tolerance but challenged by balance with many activities.    Rehab Potential  Good    PT Frequency  2x / week    PT Duration  6 weeks    PT Treatment/Interventions  Patient/family education;ADLs/Self Care Home Management;Cryotherapy;Electrical Stimulation;Iontophoresis 4mg /ml Dexamethasone;Moist Heat;Ultrasound;Dry needling;Manual techniques;Neuromuscular re-education;Gait training;Therapeutic activities;Therapeutic exercise    Consulted and Agree with Plan of  Care  Patient       Patient will benefit from skilled therapeutic intervention in order to improve the following deficits and impairments:  Pain, Decreased mobility, Decreased range of motion, Decreased strength, Decreased activity tolerance  Visit Diagnosis: Pain in right ankle and joints of right foot  Muscle weakness (generalized)  Pain in left ankle and joints of left foot  Other symptoms and signs involving the musculoskeletal system     Problem List Patient Active Problem List   Diagnosis Date Noted  . Plantar fasciitis, right 09/16/2014  . Benign prostatic hyperplasia with urinary obstruction 05/10/2014    Jeremy Juarez, PT, MPT 11/11/2017, 8:42 AM  [facility] [address]  [address] [place], [place], [address] Phone: [phone]   Fax:  [phone]  Name: Jeremy Juarez MRN: [id number] Date of Birth: [birth year]

## 2017-11-13 ENCOUNTER — Encounter: Payer: BLUE CROSS/BLUE SHIELD | Admitting: Physical Therapy

## 2017-11-18 ENCOUNTER — Encounter: Payer: Self-pay | Admitting: Physical Therapy

## 2017-11-18 ENCOUNTER — Ambulatory Visit: Payer: BLUE CROSS/BLUE SHIELD | Admitting: Physical Therapy

## 2017-11-18 DIAGNOSIS — M25572 Pain in left ankle and joints of left foot: Secondary | ICD-10-CM | POA: Diagnosis not present

## 2017-11-18 DIAGNOSIS — M25571 Pain in right ankle and joints of right foot: Secondary | ICD-10-CM

## 2017-11-18 DIAGNOSIS — R29898 Other symptoms and signs involving the musculoskeletal system: Secondary | ICD-10-CM

## 2017-11-18 DIAGNOSIS — M6281 Muscle weakness (generalized): Secondary | ICD-10-CM

## 2017-11-18 NOTE — Therapy (Addendum)
[facility] [address]  [place], [place], [address] Phone: [phone]   Fax:  [phone]  Physical Therapy Treatment / Discharge Summary  Patient Details  Name: Jeremy Juarez MRN: [id number] Date of Birth: December 23, 1955 Referring Provider: Dr Wylene Simmer    Encounter Date: 11/18/2017  PT End of Session - 11/18/17 0801    Visit Number  5    Number of Visits  12    Date for PT Re-Evaluation  12/04/17    Authorization Type  [employer] - PT only    PT Start Time  0801    PT Stop Time  0842    PT Time Calculation (min)  41 min    Activity Tolerance  Patient tolerated treatment well    Behavior During Therapy  [facility] for tasks assessed/performed       History reviewed. No pertinent past medical history.  Past Surgical History:  Procedure Laterality Date  . ANKLE SURGERY    . FINGER SURGERY    . TONSILLECTOMY      There were no vitals filed for this visit.  Subjective Assessment - 11/18/17 0802    Subjective  Continues to note an awarenss of his foot, but denies pain. Arrives to PT having just completed 30 minutes on the ellipitcal.    Pertinent History  foot injury stepping on metal rod 1986 with surgery following - he has had no trouble until 6/18; HOH     Diagnostic tests  MRI    Patient Stated Goals  avoid surgery; return to running     Currently in Pain?  No/denies                       [facility] Adult PT Treatment/Exercise - 11/18/17 0801      Exercises   Exercises  Ankle      Manual Therapy   Manual Therapy  Soft tissue mobilization;Myofascial release;Passive ROM    Soft tissue mobilization  R distal posterior tibialis & plantar fascia    Myofascial Release  R plantar fascia & posterior tibialis tendon    Passive ROM  manual R plantar fascia & posterior tibialis stretches 2 x 30 sec each      Ankle Exercises: Standing   SLS  R SLS on inveryed BOSU 3 x 20 sec - intermittent 1 pole A     Rocker Board Limitations  inverted BOSU - lateral wt shift & heel-toe weight shift x 20 each, minisquats x20 - intermittent 1 pole A    Balance Master: Limits for Stability  R/L step ups onto BOSU x15 each - intermittent 1 pole A    Heel Walk (Round Trip)  1 lap - 90 ft    Toe Walk (Round Trip)  90 ft x 2 - 2nd lap vaulting on toes    Other Standing Ankle Exercises  Alt R/L fwd lunge x10      Ankle Exercises: Seated   Other Seated Ankle Exercises  Long sitting R ankle inversion/supination with green TB x15    Other Seated Ankle Exercises  Long sitting R toe curl with green TB x15                  PT Long Term Goals - 11/11/17 0805      PT LONG TERM GOAL #1   Title  Decrease pain Rt ankle/foot by 50-75% allowing patient to ambulate > 1 mile without pain 12/04/17  Status  On-going    Target Date  12/04/17      PT LONG TERM GOAL #2   Title  Increase Rt ankle DF to 8 to 10 deg 12/04/17    Status  On-going    Target Date  12/04/17      PT LONG TERM GOAL #3   Title  Increase strength Rt ankle to 5/5 without pain 12/04/17    Status  On-going    Target Date  12/04/17      PT LONG TERM GOAL #4   Title  Independent HEP 12/04/17    Status  On-going    Target Date  12/04/17      PT LONG TERM GOAL #5   Title  Improve FOTO to </= 32% limitation 12/04/17    Status  On-going    Target Date  12/04/17            Plan - 11/18/17 0807    Clinical Impression Statement  Jeremy Juarez continues to report an increased "awareness" of R foot, predominantly stiffness/tightness, but denies recent pain. Continues to note greatest challenge with SLS balance, esp on unstable surfaces, but good tolerance for therapy progression. Some crepitus noted along distal R posterior tibialis tendon & plantar fascia which repsonded well to manual therapy and stretching. Pt reporting good improvement this far with PT and will continue to progress toward goals.    Rehab Potential  Good    PT Frequency  --     PT Duration  --    PT Treatment/Interventions  Patient/family education;ADLs/Self Care Home Management;Cryotherapy;Electrical Stimulation;Iontophoresis 7m/ml Dexamethasone;Moist Heat;Ultrasound;Dry needling;Manual techniques;Neuromuscular re-education;Gait training;Therapeutic activities;Therapeutic exercise    Consulted and Agree with Plan of Care  Patient       Patient will benefit from skilled therapeutic intervention in order to improve the following deficits and impairments:  Pain, Decreased mobility, Decreased range of motion, Decreased strength, Decreased activity tolerance  Visit Diagnosis: Pain in right ankle and joints of right foot  Muscle weakness (generalized)  Pain in left ankle and joints of left foot  Other symptoms and signs involving the musculoskeletal system     Problem List Patient Active Problem List   Diagnosis Date Noted  . Plantar fasciitis, right 09/16/2014  . Benign prostatic hyperplasia with urinary obstruction 05/10/2014    JPercival Spanish PT, MPT 11/18/2017, 8:45 AM  [facility] [place] [address]Phone: [phone]  Fax:  [phone] Name: RDwan HemmelgarnMRN: [id number] of Birth: 9Mar 16, 1957 PHYSICAL THERAPY DISCHARGE SUMMARY  Visits from Start of Care: 5  Current functional level related to goals / functional outcomes:   Refer to above clinical impression for status as of last visit on 11/18/17. Further PT was recommended but pt did not return in >30 days, therefore will proceed with discharge from PT for this episode.   Remaining deficits:   As above.   Education / Equipment:   HEP  Plan: Patient agrees to discharge.  Patient goals were not met. Patient is being discharged due to not returning since the last visit.  ?????     JPercival Spanish PT, MPT 01/28/18, 8:20 AM  [facility] [place] [address]Phone: [phone]  Fax:  [phone]

## 2017-11-20 ENCOUNTER — Encounter: Payer: BLUE CROSS/BLUE SHIELD | Admitting: Physical Therapy

## 2017-11-25 ENCOUNTER — Ambulatory Visit: Payer: BLUE CROSS/BLUE SHIELD | Admitting: Physical Therapy

## 2019-09-20 IMAGING — CR DG FOOT COMPLETE 3+V*L*
3 series · 3 of 3 positions shown · non-contrast
Comparison: None.

CLINICAL DATA: Left great toe and foot pain

EXAM:
LEFT FOOT - COMPLETE 3+ VIEW

[t foot ap left]
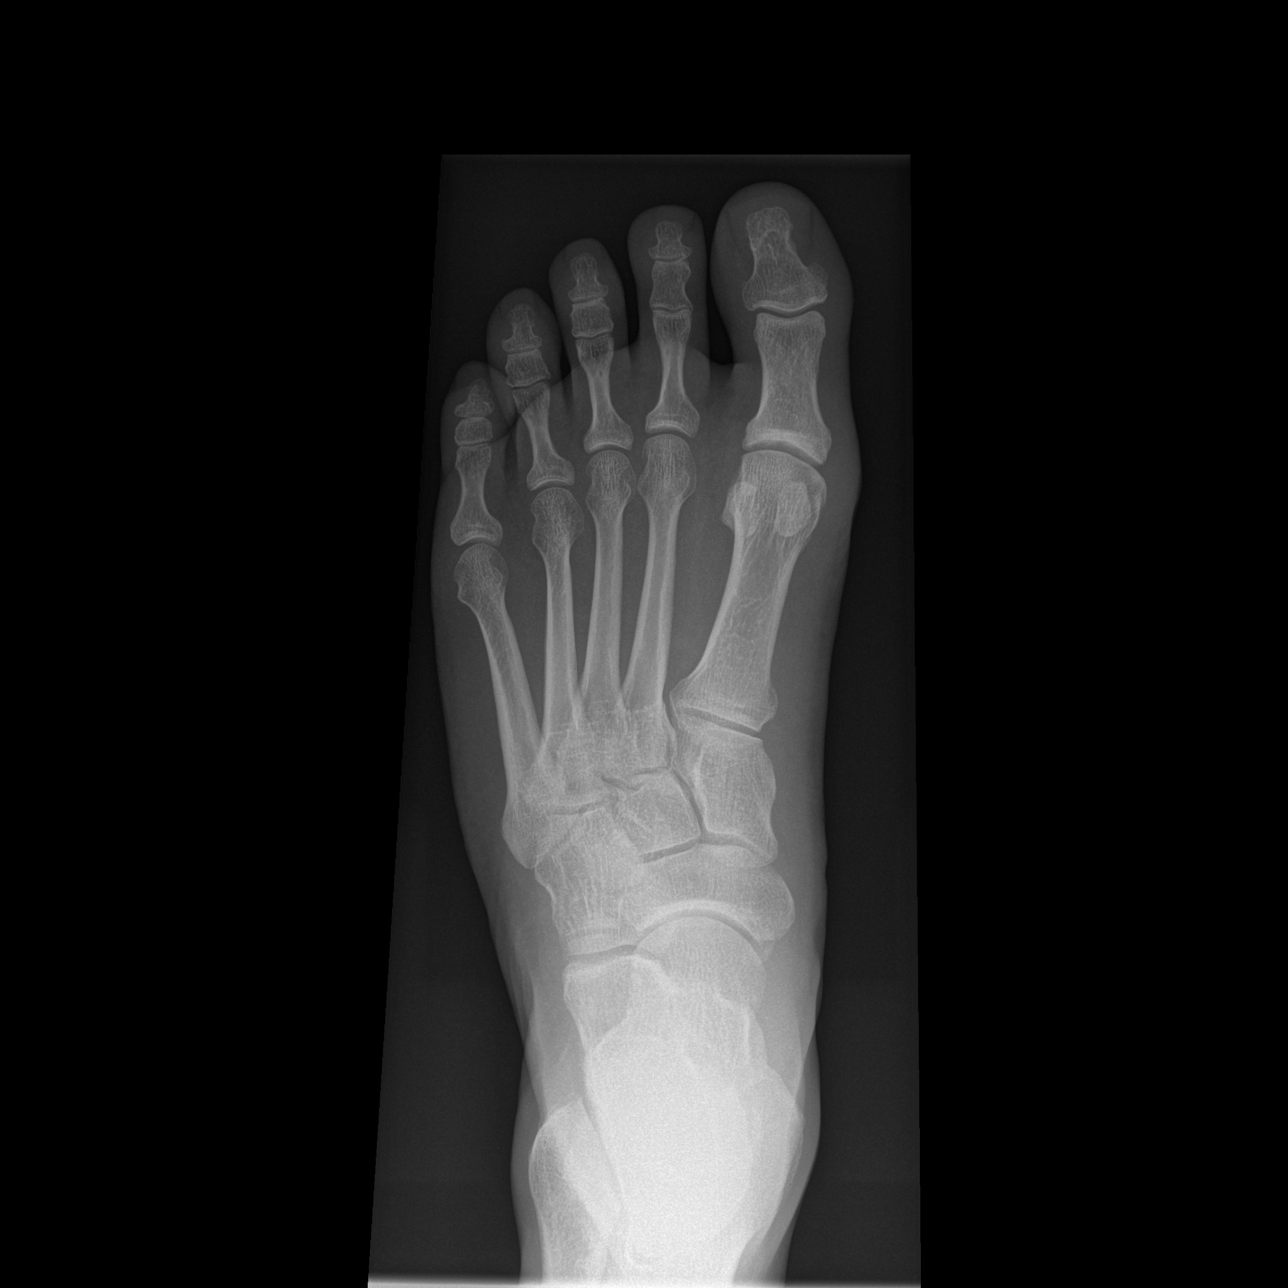

[t foot oblique left]
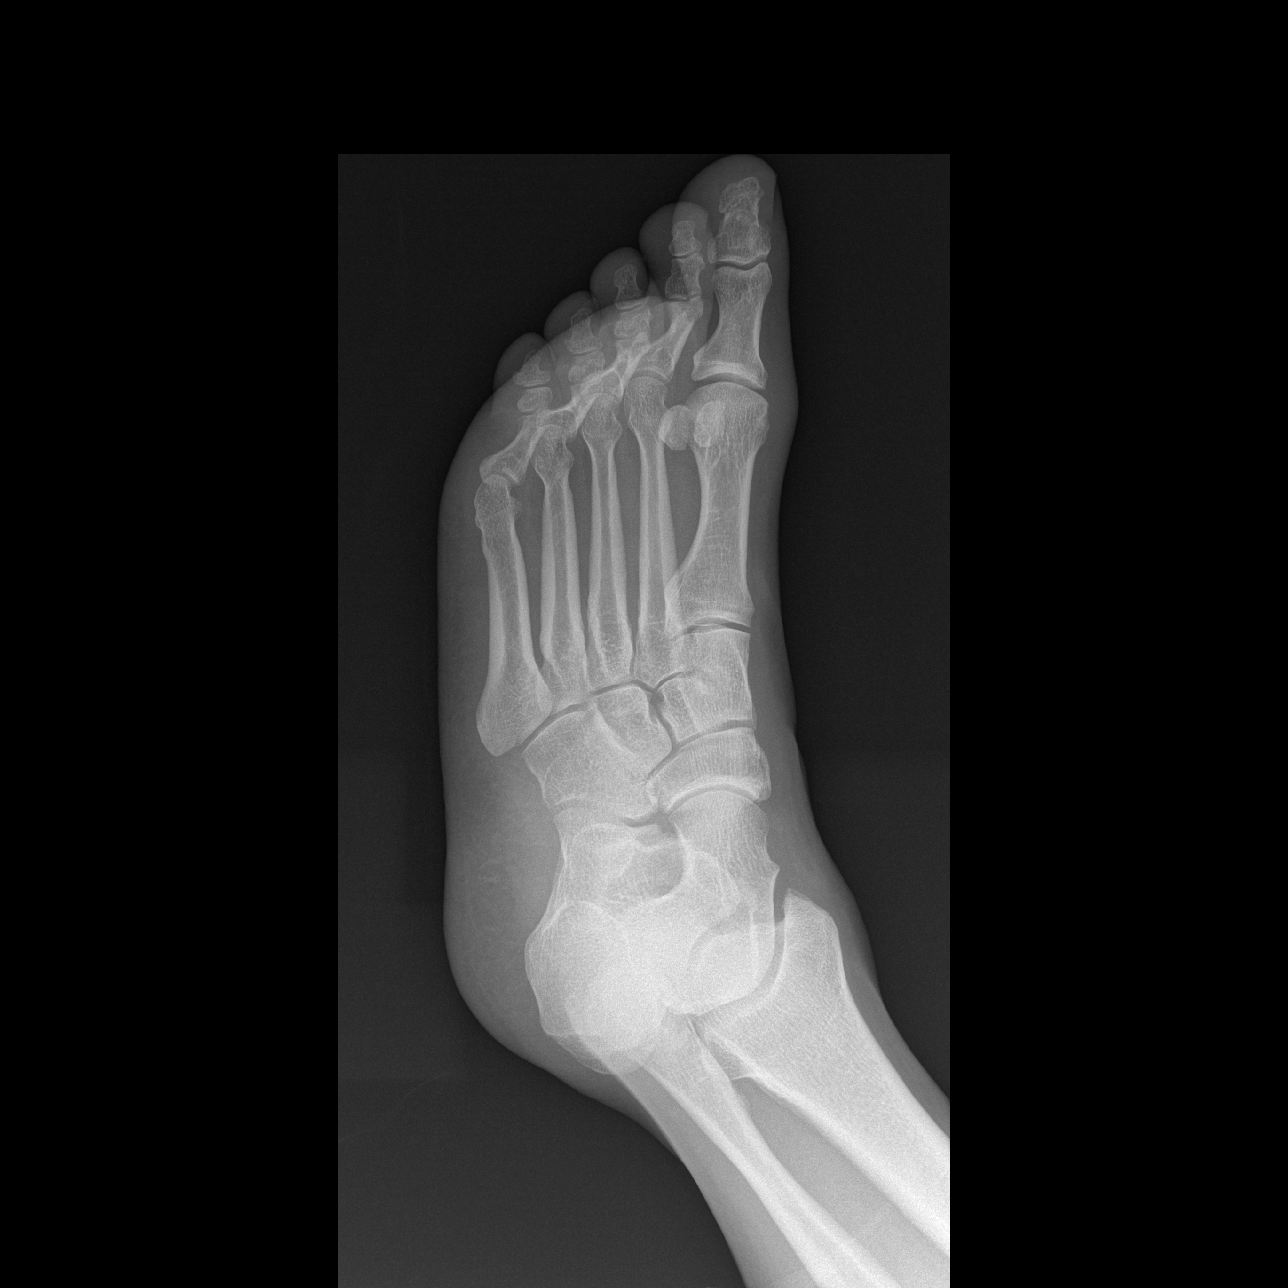

[t foot lat left]
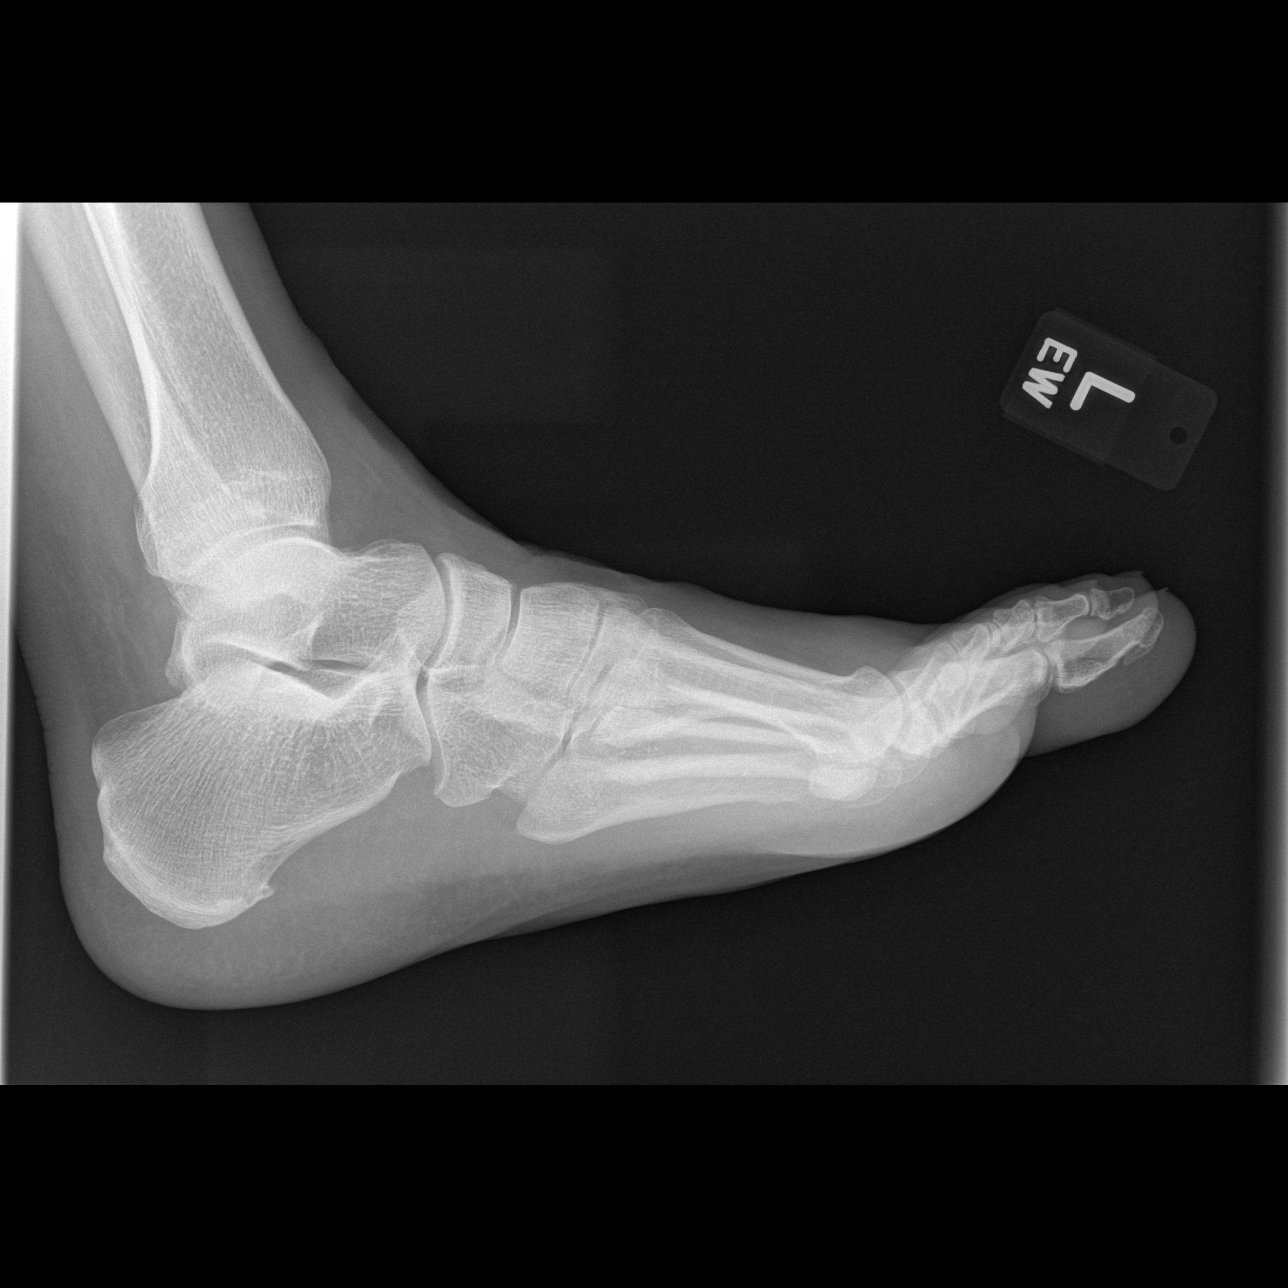

[3 of 3 positions shown; findings below may reference images not displayed]

FINDINGS: There is no evidence of fracture or dislocation. There is no
evidence of arthropathy or other focal bone abnormality. Soft
tissues are unremarkable.
IMPRESSION: Negative.

## 2021-08-21 ENCOUNTER — Encounter (HOSPITAL_BASED_OUTPATIENT_CLINIC_OR_DEPARTMENT_OTHER): Payer: Self-pay | Admitting: Emergency Medicine

## 2021-08-21 ENCOUNTER — Other Ambulatory Visit: Payer: Self-pay

## 2021-08-21 ENCOUNTER — Emergency Department (HOSPITAL_BASED_OUTPATIENT_CLINIC_OR_DEPARTMENT_OTHER)
Admission: EM | Admit: 2021-08-21 | Discharge: 2021-08-21 | Disposition: A | Discharge status: Home / Self Care | Payer: Managed Care, Other (non HMO) | Attending: Emergency Medicine | Admitting: Emergency Medicine

## 2021-08-21 DIAGNOSIS — R040 Epistaxis: Secondary | ICD-10-CM | POA: Insufficient documentation

## 2021-08-21 NOTE — ED Provider Notes (Signed)
[facility] EMERGENCY DEPARTMENT Provider Note   CSN: [id number] Arrival date & time: 08/21/21  1019     History  Chief Complaint  Patient presents with   Epistaxis    Jeremy Juarez is a 66 y.o. male.  66 year old male presents with concern for nosebleed which woke him from his sleep at 5am today, right side, resolved after a few minutes.  Patient woke up this morning and was moving laundry from the washer to the dryer, bent over when bleeding resumed and did not resolve with ice pack and pressure.  EMS was called, applied Afrin and bleeding resolved.  Denies history of recurrent bleeds, not anticoagulated, no injury to the nose.       Home Medications Prior to Admission medications   Medication Sig Start Date End Date Taking? Authorizing Provider  colchicine 0.6 MG tablet Take 1 tablet (0.6 mg total) by mouth daily for 7 days. 06/28/17 07/05/17  Long, Arlyss Repress, MD  traMADol (ULTRAM) 50 MG tablet Take 1 tablet (50 mg total) by mouth every 6 (six) hours as needed. Patient not taking: Reported on 10/23/2017 06/28/17   Long, Arlyss Repress, MD      Allergies    Patient has no known allergies.    Review of Systems   Review of Systems Negative except as per HPI Physical Exam Updated Vital Signs BP (!) 162/112   Pulse 80   Temp 98 F (36.7 C) (Oral)   Resp 16   SpO2 95%  Physical Exam Vitals and nursing note reviewed.  Constitutional:      General: He is not in acute distress.    Appearance: He is well-developed. He is not diaphoretic.  HENT:     Head: Normocephalic and atraumatic.     Nose: No nasal deformity or signs of injury.     Comments: Dried blood noted to left nare, somewhat irritated appearance of left septum.  Right side unremarkable.  No active bleeding.    Mouth/Throat:     Mouth: Mucous membranes are moist.     Pharynx: No oropharyngeal exudate or posterior oropharyngeal erythema.  Pulmonary:     Effort: Pulmonary effort is normal.  Skin:     General: Skin is warm and dry.     Findings: No erythema or rash.  Neurological:     Mental Status: He is alert and oriented to person, place, and time.  Psychiatric:        Behavior: Behavior normal.     ED Results / Procedures / Treatments   Labs (all labs ordered are listed, but only abnormal results are displayed) Labs Reviewed - No data to display  EKG None  Radiology No results found.  Procedures Procedures    Medications Ordered in ED Medications - No data to display  ED Course/ Medical Decision Making/ A&P                           Medical Decision Making  66 year old male presents with concern for nosebleed as above.  On exam, has area to left septum which appears irritated and may have been source of bleeding however no active bleeding at this time to definitively identify source.  Right nare normal.  Discussed management of nosebleeds at home, given referral to ENT if bleeding persists with return to ER precautions if unable to stop bleeding or severe bleeding occurs.        Final Clinical Impression(s) / ED Diagnoses Final  diagnoses:  Epistaxis    Rx / DC Orders ED Discharge Orders     None         Alden Hipp 08/21/21 1048    Cheryll Cockayne, MD 08/26/21 0900

## 2021-08-25 DIAGNOSIS — R040 Epistaxis: Secondary | ICD-10-CM | POA: Diagnosis present

## 2021-08-26 ENCOUNTER — Other Ambulatory Visit: Payer: Self-pay

## 2021-08-26 ENCOUNTER — Emergency Department (HOSPITAL_BASED_OUTPATIENT_CLINIC_OR_DEPARTMENT_OTHER)
Admission: EM | Admit: 2021-08-26 | Discharge: 2021-08-26 | Disposition: A | Discharge status: Home / Self Care | Payer: Managed Care, Other (non HMO) | Attending: Emergency Medicine | Admitting: Emergency Medicine

## 2021-08-26 ENCOUNTER — Encounter (HOSPITAL_BASED_OUTPATIENT_CLINIC_OR_DEPARTMENT_OTHER): Payer: Self-pay

## 2021-08-26 DIAGNOSIS — R040 Epistaxis: Secondary | ICD-10-CM | POA: Diagnosis not present

## 2021-08-26 MED ORDER — OXYMETAZOLINE HCL 0.05 % NA SOLN
1.0000 | Freq: Once | NASAL | Status: AC
  Administered 2021-08-26: 1 via NASAL
  Filled 2021-08-26: qty 30

## 2021-08-26 NOTE — ED Triage Notes (Signed)
Pt reports nosebleed from both nostrils that started around 2330. Pt recently seen for same. Pt seen by ENT today and had both sides of his nose cauterized.

## 2021-08-26 NOTE — ED Provider Notes (Signed)
[facility] Provider Note: Jeremy Dell, MD, FACEP  CSN: [id number] MRN: [id number] ARRIVAL: 08/25/21 at 2359 ROOM: MH10/[facility]10   CHIEF COMPLAINT  Epistaxis   HISTORY OF PRESENT ILLNESS  08/26/21 2:02 AM Jeremy Juarez is a 66 y.o. male bilateral epistaxis that began about 11:30 PM yesterday evening.  He was seen for the same on 08/21/2021 for left epistaxis.  He was evaluated in the ED and found to have no active bleeding.  He was seen in the [facility] ENT clinic yesterday and underwent bilateral silver nitrate cauterization.  He was lifting objects at home and bending over yesterday evening and he thinks his put pressure on his nose which triggered the rebleeding.  The bleeding was fairly heavy and it was bleeding in the back of his throat as well.  The bleeding resolved while in the waiting room and he is without bleeding presently.  He is having no pain.  He is having no nausea.   History reviewed. No pertinent past medical history.  Past Surgical History:  Procedure Laterality Date   ANKLE SURGERY     FINGER SURGERY     TONSILLECTOMY      History reviewed. No pertinent family history.  Social History   Tobacco Use   Smoking status: Never   Smokeless tobacco: Never  Substance Use Topics   Alcohol use: Never    Alcohol/week: 0.0 standard drinks of alcohol   Drug use: Never    Prior to Admission medications   Not on File    Allergies Patient has no known allergies.   REVIEW OF SYSTEMS  Negative except as noted here or in the History of Present Illness.   PHYSICAL EXAMINATION  Initial Vital Signs Blood pressure (!) 137/97, pulse 68, resp. rate 18, height 5\' 9"  (1.753 m), weight 87.1 kg, SpO2 95 %.  Examination General: Well-developed, well-nourished male in no acute distress; appearance consistent with age of record HENT: normocephalic; atraumatic; dried blood in nares Eyes: Normal appearance Neck: supple Heart: regular  rate and rhythm Lungs: clear to auscultation bilaterally Abdomen: soft; nondistended; nontender; bowel sounds present Extremities: No deformity; full range of motion Neurologic: Awake, alert and oriented; motor function intact in all extremities and symmetric; no facial droop Skin: Warm and dry Psychiatric: Normal mood and affect   RESULTS  Summary of this visit's results, reviewed and interpreted by myself:   EKG Interpretation  Date/Time:    Ventricular Rate:    PR Interval:    QRS Duration:   QT Interval:    QTC Calculation:   R Axis:     Text Interpretation:         Laboratory Studies: No results found for this or any previous visit (from the past 24 hour(s)). Imaging Studies: No results found.  ED COURSE and MDM  Nursing notes, initial and subsequent vitals signs, including pulse oximetry, reviewed and interpreted by myself.  Vitals:   08/26/21 0005 08/26/21 0008 08/26/21 0130  BP:  (!) 157/114 (!) 137/97  Pulse:  (!) 104 68  Resp:  15 18  SpO2:  96% 95%  Weight: 87.1 kg    Height: 5\' 9"  (1.753 m)     Medications  oxymetazoline (AFRIN) 0.05 % nasal spray 1 spray (1 spray Each Nare Given 08/26/21 0139)   The patient was given Afrin and was advised to use it as needed.  There is no active bleeding.  He was advised to sleep sitting up in a recliner  overnight to minimize pressure on his nasal mucosae.   PROCEDURES  Procedures   ED DIAGNOSES     ICD-10-CM   1. Anterior epistaxis  R04.0          Catilyn Boggus, Jonny Ruiz, MD 08/26/21 0210

## 2021-08-26 NOTE — ED Notes (Signed)
Pt A&OX4 ambulatory at d/c with independent steady gait. Pt verbalized understanding of d/c instructions and follow up care. 

## 2021-08-26 NOTE — ED Notes (Signed)
Pt ambulatory to room with independent stead gait. His nose has stopped bleeding.
